# Patient Record
Sex: Male | Born: 1960 | Race: White | Hispanic: No | Marital: Married | Smoking: Never smoker
Health system: Southern US, Community
[De-identification: ages and names within clinical notes are randomized; demographics above are authoritative.]

## PROBLEM LIST (undated history)

## (undated) DIAGNOSIS — E039 Hypothyroidism, unspecified: Secondary | ICD-10-CM

## (undated) HISTORY — PX: CATARACT EXTRACTION: SUR2

## (undated) HISTORY — DX: Hypothyroidism, unspecified: E03.9

---

## 2015-05-29 HISTORY — PX: EYE SURGERY: SHX253

## 2015-08-01 ENCOUNTER — Encounter: Payer: Self-pay | Admitting: *Deleted

## 2015-08-01 ENCOUNTER — Encounter: Payer: Self-pay | Admitting: Family Medicine

## 2015-08-01 ENCOUNTER — Ambulatory Visit (INDEPENDENT_AMBULATORY_CARE_PROVIDER_SITE_OTHER): Payer: BLUE CROSS/BLUE SHIELD | Admitting: Family Medicine

## 2015-08-01 VITALS — BP 107/70 | HR 60 | Temp 98.4°F | Resp 16 | Ht 75.0 in | Wt 170.0 lb

## 2015-08-01 DIAGNOSIS — E559 Vitamin D deficiency, unspecified: Secondary | ICD-10-CM | POA: Diagnosis not present

## 2015-08-01 DIAGNOSIS — Z0000 Encounter for general adult medical examination without abnormal findings: Secondary | ICD-10-CM | POA: Diagnosis not present

## 2015-08-01 MED ORDER — VITAMIN D-3 25 MCG (1000 UT) PO CAPS: ORAL_CAPSULE | ORAL | Status: AC

## 2015-08-01 NOTE — Patient Instructions (Signed)
To get flu shot next week at work.

## 2015-08-01 NOTE — Progress Notes (Signed)
Name: Bradley Carson   MRN: [id number]    DOB: Dec 26, 1960   Date:08/01/2015       Progress Note  Subjective  Chief Complaint  Chief Complaint  Patient presents with  . Annual Exam    HPI  Here for complete physical.  Has no c/o.  Had hole in retina.  Treated urgically at [facility].  Plans to have colonoscopy early next year.   CMP, CBC TSH Lipids all normal.  Vit D sl. Low at 22.5 No problem-specific assessment & plan notes found for this encounter.   Past Medical History  Diagnosis Date  . Hypothyroidism     Past Surgical History  Procedure Laterality Date  . Eye surgery  05/29/2015    Family History  Problem Relation Age of Onset  . Cataracts Mother     Social History   Social History  . Marital Status: Married    Spouse Name: N/A  . Number of Children: N/A  . Years of Education: N/A   Occupational History  . Not on file.   Social History Main Topics  . Smoking status: Never Smoker   . Smokeless tobacco: Never Used  . Alcohol Use: 0.0 oz/week    0 Standard drinks or equivalent per week     Comment: occasional  . Drug Use: No  . Sexual Activity: Not on file   Other Topics Concern  . Not on file   Social History Narrative  . No narrative on file     Current outpatient prescriptions:  .  Cholecalciferol (VITAMIN D-3) 1000 UNITS CAPS, Take 1 capsule by mouth daily., Disp: , Rfl:   No Known Allergies   Review of Systems  Constitutional: Negative for fever, chills, weight loss and malaise/fatigue.  HENT: Negative for congestion, hearing loss and nosebleeds.   Eyes: Positive for blurred vision (L. eye, sec. to hole in retina.). Negative for double vision and pain.  Respiratory: Negative for cough, sputum production, shortness of breath and wheezing.   Cardiovascular: Negative for chest pain, palpitations, orthopnea and leg swelling.  Gastrointestinal: Negative for heartburn, abdominal pain and blood in stool.  Genitourinary: Negative for dysuria, urgency  and frequency.  Musculoskeletal: Negative for myalgias and joint pain.  Skin: Negative for itching and rash.  Neurological: Negative for dizziness, sensory change, focal weakness, weakness and headaches.  Psychiatric/Behavioral: Negative for depression. The patient is not nervous/anxious.       Objective  Filed Vitals:   08/01/15 1523  BP: 107/70  Pulse: 60  Temp: 98.4 F (36.9 C)  TempSrc: Oral  Resp: 16  Height: 6\' 3"  (1.905 m)  Weight: 170 lb (77.111 kg)    Physical Exam  Constitutional: He is oriented to person, place, and time and well-developed, well-nourished, and in no distress. No distress.  HENT:  Head: Normocephalic and atraumatic.  Right Ear: External ear normal.  Left Ear: External ear normal.  Nose: Nose normal.  Mouth/Throat: Oropharynx is clear and moist. No oropharyngeal exudate.  Eyes: Conjunctivae and EOM are normal. Pupils are equal, round, and reactive to light. No scleral icterus.  Fundoscopic exam:      The right eye shows no arteriolar narrowing, no AV nicking, no exudate and no hemorrhage.       The left eye shows no arteriolar narrowing, no AV nicking, no exudate and no hemorrhage.  Neck: Normal range of motion.  Cardiovascular: Normal rate, regular rhythm, normal heart sounds and intact distal pulses.  Exam reveals no gallop and no friction rub.  No murmur heard. Pulmonary/Chest: Effort normal and breath sounds normal. No respiratory distress. He has no wheezes. He has no rales.  Abdominal: Soft. Bowel sounds are normal. He exhibits no distension and no mass. There is no tenderness.  Genitourinary: Rectum normal, prostate normal and penis normal. No discharge found.  Musculoskeletal: Normal range of motion. He exhibits no edema or tenderness.  Neurological: He is alert and oriented to person, place, and time. No cranial nerve deficit. Gait normal.  Skin: Skin is warm and dry.  Psychiatric: Mood, memory, affect and judgment normal.  Vitals  reviewed.      No results found for this or any previous visit (from the past 2160 hour(s)).   Assessment & Plan  Problem List Items Addressed This Visit    None      No orders of the defined types were placed in this encounter.    1. Vitamin D deficiency  - Cholecalciferol (VITAMIN D-3) 1000 UNITS CAPS; Take 2 each day  Dispense: 100 capsule; Refill: 12  2. Health care maintenance

## 2015-11-19 ENCOUNTER — Other Ambulatory Visit: Payer: Self-pay | Admitting: Unknown Physician Specialty

## 2015-11-19 DIAGNOSIS — H903 Sensorineural hearing loss, bilateral: Secondary | ICD-10-CM

## 2015-12-24 ENCOUNTER — Ambulatory Visit
Admission: RE | Admit: 2015-12-24 | Discharge: 2015-12-24 | Disposition: A | Discharge status: Home / Self Care | Payer: BLUE CROSS/BLUE SHIELD | Source: Ambulatory Visit | Attending: Unknown Physician Specialty | Admitting: Unknown Physician Specialty

## 2015-12-24 DIAGNOSIS — H903 Sensorineural hearing loss, bilateral: Secondary | ICD-10-CM

## 2015-12-24 DIAGNOSIS — D333 Benign neoplasm of cranial nerves: Secondary | ICD-10-CM | POA: Diagnosis not present

## 2015-12-24 MED ORDER — GADOBENATE DIMEGLUMINE 529 MG/ML IV SOLN
20.0000 mL | Freq: Once | INTRAVENOUS | Status: AC | PRN
Start: 2015-12-24 — End: 2015-12-24
  Administered 2015-12-24: 16 mL via INTRAVENOUS

## 2015-12-29 ENCOUNTER — Other Ambulatory Visit: Payer: Self-pay | Admitting: Unknown Physician Specialty

## 2015-12-29 DIAGNOSIS — H903 Sensorineural hearing loss, bilateral: Secondary | ICD-10-CM

## 2016-04-17 IMAGING — MR MR BRAIN/IAC WO/W
7 of 11 series · 33 of 48 positions shown · IV contrast (multihance)
Comparison: None.

CLINICAL DATA: 54-year-old male with sensorineural hearing loss
over the last 2 years, worse on the right. No known injury. Initial
encounter.

EXAM:
MRI HEAD WITHOUT AND WITH CONTRAST
TECHNIQUE: Multiplanar, multiecho pulse sequences of the brain and surrounding
structures were obtained without and with intravenous contrast.
CONTRAST:  16mL MULTIHANCE GADOBENATE DIMEGLUMINE 529 MG/ML IV SOLN

[Series 2: T1 · sagittal · 5.0mm · 0.45mm/px · 3 of 27 slices shown (1 of 2)]
[im 1/27]
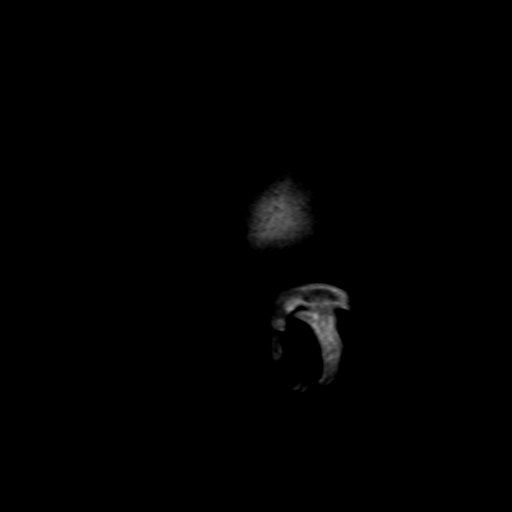
[im 14/27]
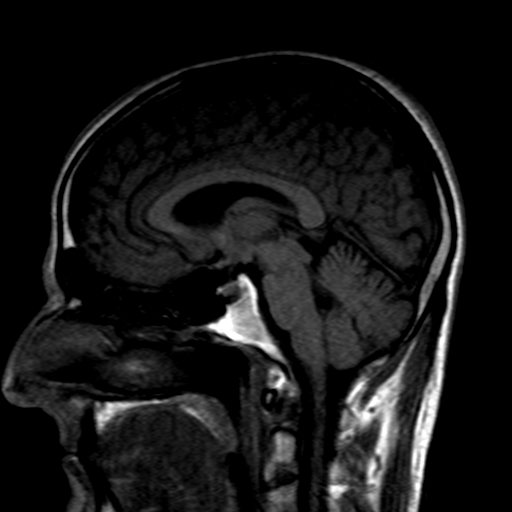
[im 27/27]
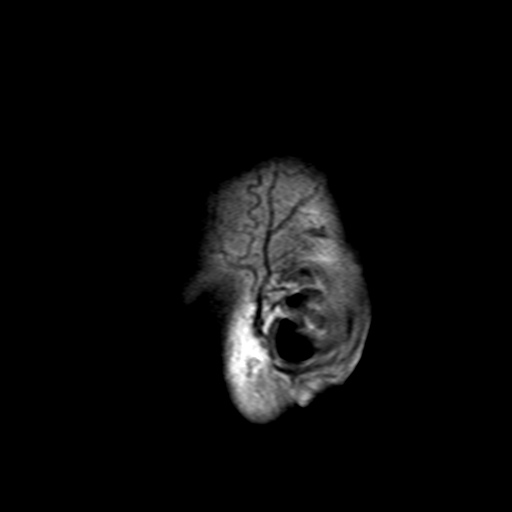

[Series 4: DWI · axial · 4.0mm · 0.94mm/px · z∈[-89,+79]mm · 6 of 43 slices shown (1 of 2)]
[im 1/43]
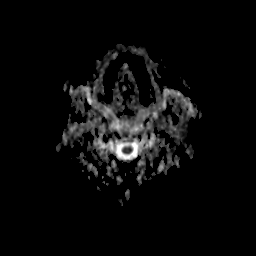
[im 9/43]
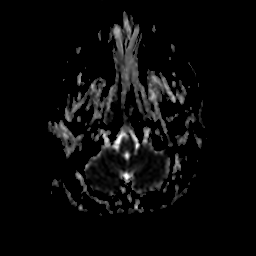
[im 17/43]
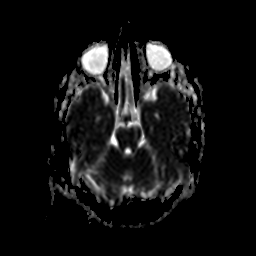
[im 26/43]
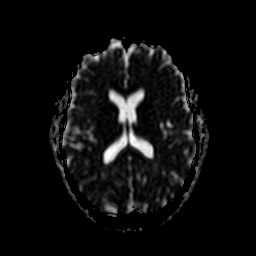
[im 34/43]
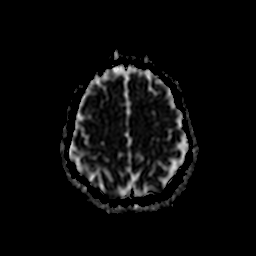
[im 43/43]
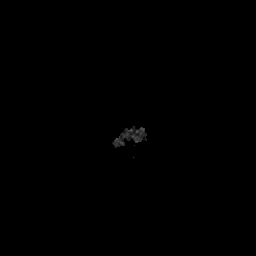

[Series 5: DWI · axial · 4.0mm · 0.94mm/px · z∈[-89,+79]mm · 6 of 43 slices shown (2 of 2)]
[im 1/43]
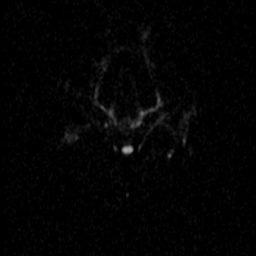
[im 9/43]
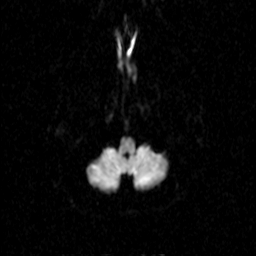
[im 17/43]
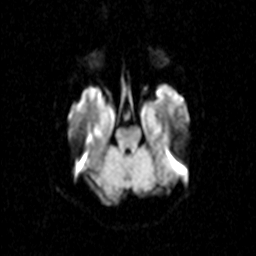
[im 26/43]
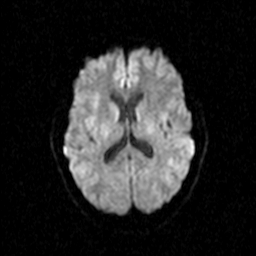
[im 34/43]
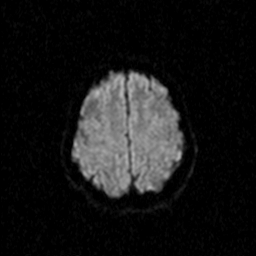
[im 43/43]
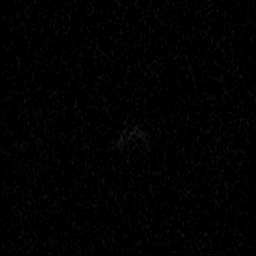

[Series 6: T2 · axial · 5.0mm · 0.45mm/px · z∈[-86,+83]mm · 4 of 27 slices shown]
[im 1/27]
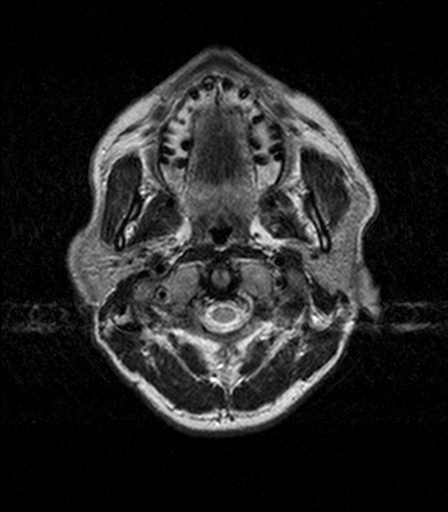
[im 9/27]
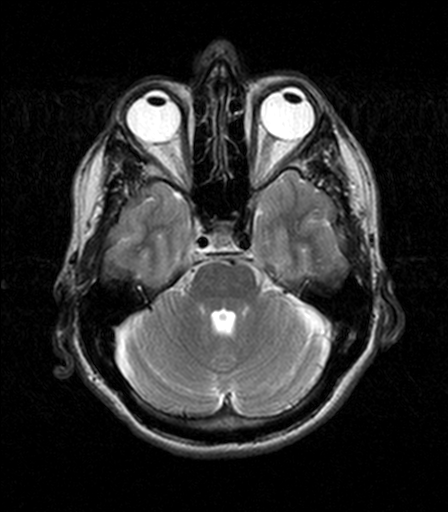
[im 18/27]
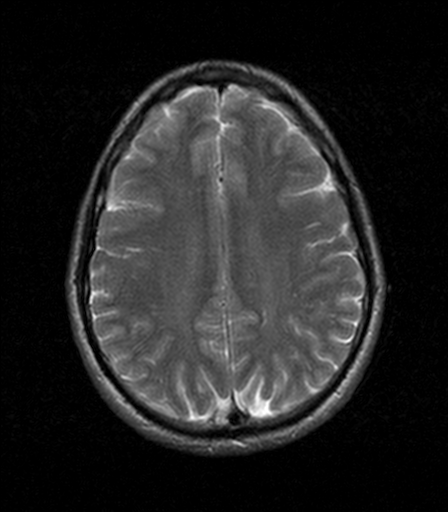
[im 27/27]
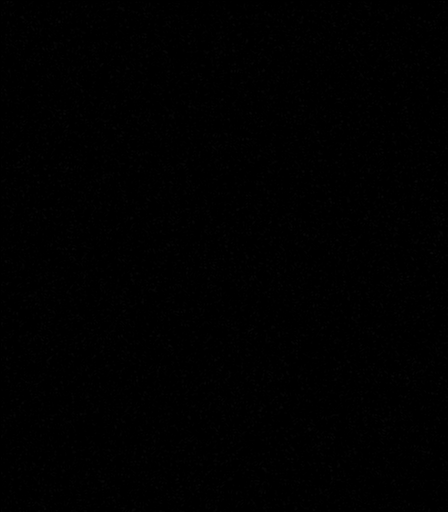

[Series 7: FLAIR · axial · 5.0mm · 0.90mm/px · z∈[-86,+83]mm · 4 of 27 slices shown]
[im 1/27]
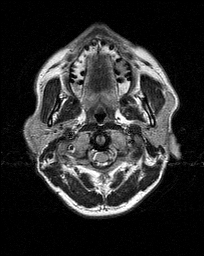
[im 9/27]
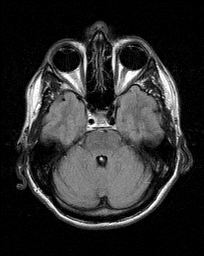
[im 18/27]
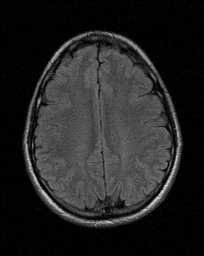
[im 27/27]
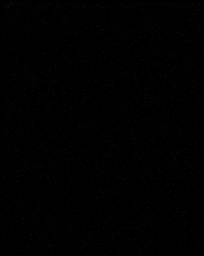

[Series 8: T1 · coronal · 3.0mm · 0.70mm/px · 1 of 15 slices shown (2 of 2)]
[im 1/15]
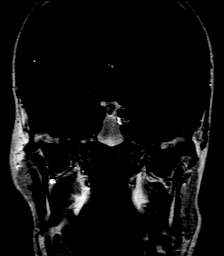

[Series 13: T1 post-contrast · axial · 3.0mm · 0.45mm/px · z∈[-96,+93]mm · 9 of 64 slices shown]
[im 1/64]
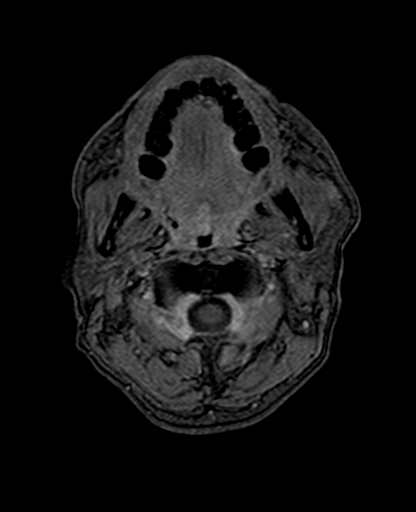
[im 8/64]
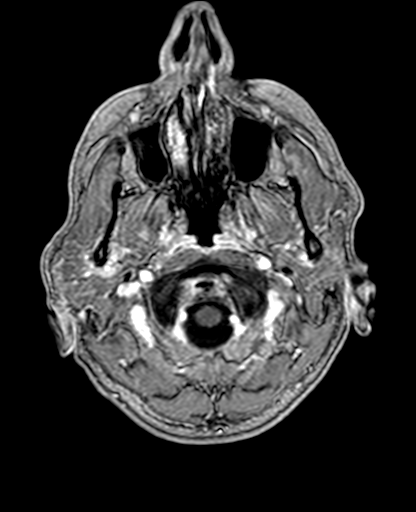
[im 16/64]
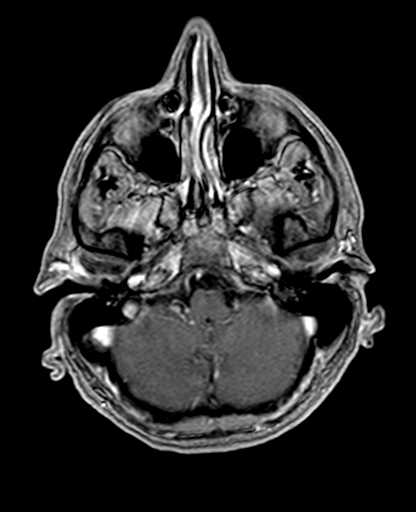
[im 24/64]
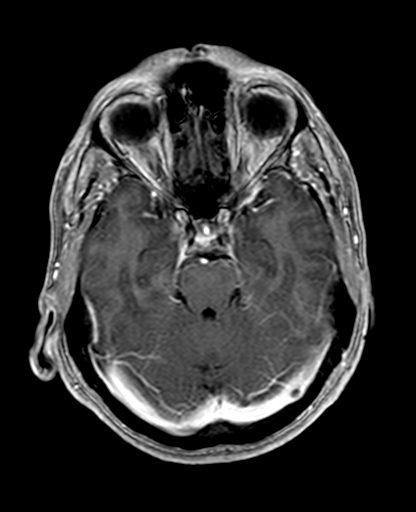
[im 32/64]
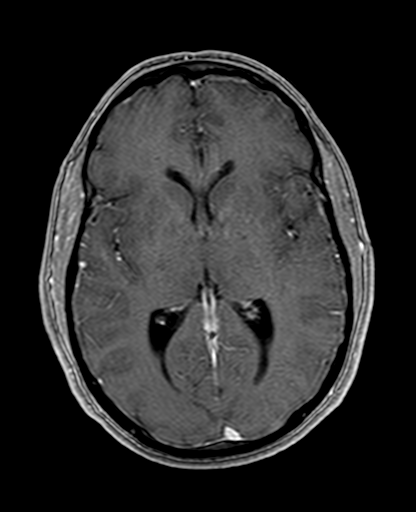
[im 40/64]
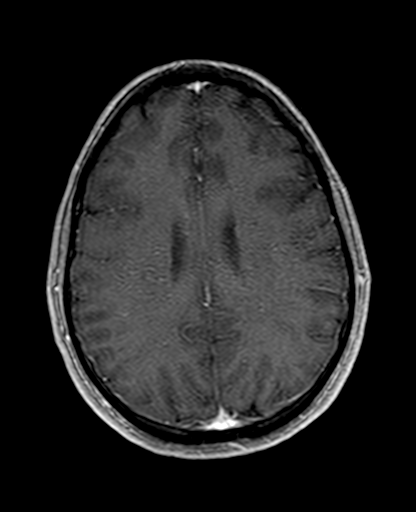
[im 48/64]
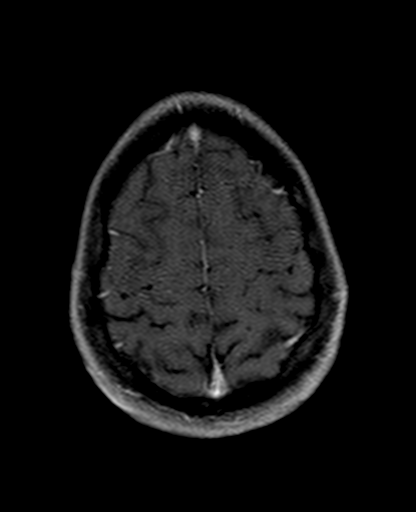
[im 56/64]
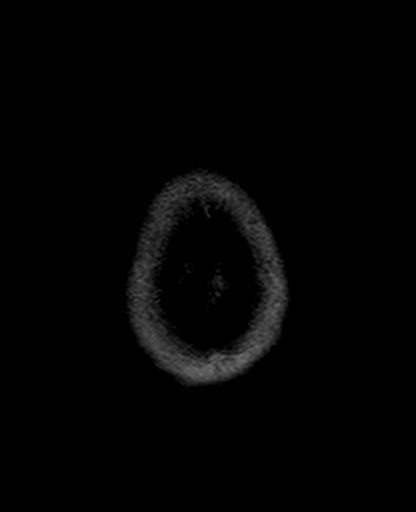
[im 64/64]
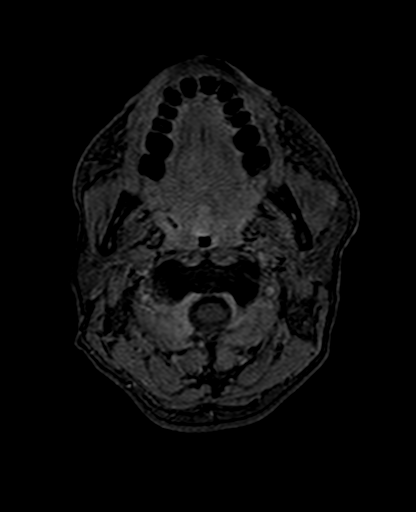

[33 of 48 positions shown; findings below may reference images not displayed]

FINDINGS: Cerebral volume is normal. No restricted diffusion to suggest acute
infarction. No midline shift, mass effect, evidence of mass lesion,
ventriculomegaly, extra-axial collection or acute intracranial
hemorrhage. Cervicomedullary junction and pituitary are within
normal limits. Negative visualized cervical spine. Major
intracranial vascular flow voids are within normal limits. Gray and
white matter signal is within normal limits for age throughout the
brain. Excluding the IAC findings described below, No abnormal
enhancement identified.

Visualized bone marrow signal is within normal limits. Visualized
orbit soft tissues are within normal limits. Paranasal sinuses are
clear. Negative scalp soft tissues.

Dedicated IAC imaging. Both cerebellopontine angles remain within
normal limits. The left side cisternal and intracanalicular 7th and
8th cranial nerves segments are within normal limits.

However, there is a solidly enhancing mass affecting the right
seventh and eighth nerves. The lesion is primarily within the right
IAC, protruding slightly but not effacing the right lateral pre
pontine cistern. The lesion measures 6 x 11 x 6 mm (AP by transverse
by CC), and tracks nearly to the fundus (series 9, image 28). There
is preserved T2 signal in the right cochlea and vestibular
structures with no abnormal enhancement. The right mastoids are
clear.

The left cochlea and vestibular structures also appear normal. Left
mastoids are clear. Negative stylomastoid foramina. Negative
visualized parotid glands. No other abnormal enhancement identified.
IMPRESSION: 1. Positive for small right vestibular schwannoma occupying the IAC,
6 x 11 x 6 mm.
2. Otherwise negative internal auditory imaging.
3. Otherwise normal MRI appearance of the brain.

## 2016-06-22 ENCOUNTER — Ambulatory Visit: Payer: BLUE CROSS/BLUE SHIELD

## 2016-06-29 ENCOUNTER — Ambulatory Visit
Admission: RE | Admit: 2016-06-29 | Discharge: 2016-06-29 | Disposition: A | Discharge status: Home / Self Care | Payer: BLUE CROSS/BLUE SHIELD | Source: Ambulatory Visit | Attending: Unknown Physician Specialty | Admitting: Unknown Physician Specialty

## 2016-06-29 DIAGNOSIS — H903 Sensorineural hearing loss, bilateral: Secondary | ICD-10-CM

## 2016-06-29 DIAGNOSIS — D333 Benign neoplasm of cranial nerves: Secondary | ICD-10-CM | POA: Insufficient documentation

## 2016-06-29 MED ORDER — GADOBENATE DIMEGLUMINE 529 MG/ML IV SOLN
15.0000 mL | Freq: Once | INTRAVENOUS | Status: AC | PRN
  Administered 2016-06-29: 15 mL via INTRAVENOUS

## 2016-06-30 ENCOUNTER — Other Ambulatory Visit: Payer: Self-pay | Admitting: Unknown Physician Specialty

## 2016-06-30 DIAGNOSIS — D333 Benign neoplasm of cranial nerves: Secondary | ICD-10-CM

## 2017-02-17 ENCOUNTER — Encounter: Payer: Self-pay | Admitting: Family Medicine

## 2017-02-18 LAB — COMPREHENSIVE METABOLIC PANEL
ALK PHOS: 52 U/L
ALT: 13
AST: 11 U/L
Albumin/Globulin Ratio: 1.5
Albumin: 4.3
BILIRUBIN TOTAL: 0.5 mg/dL
BUN / CREAT RATIO: 13
BUN: 16
CHLORIDE: 105 mmol/L
Calcium: 9.6 mg/dL
Creat: 1.21
EGFR (African American): 77
GFR CALC NON AF AMER: 67
GGT: 18
GLUCOSE: 97
Globulin: 2.9
Iron: 91
LDH: 179 (ref ?–224)
POTASSIUM: 4.8 mmol/L
Phosphorus: 3.3
Sodium: 145 — AB (ref ?–144)
TOTAL PROTEIN: 7.2 g/dL
Uric Acid: 6.3

## 2017-02-18 LAB — CBC WITH DIFFERENTIAL
BASO%: 1 %
BASOS ABS: 0 /uL
EOS%: 6 %
Eosinophils Absolute: 0 /uL
GRANS (ABSOLUTE): 0
GRANULOCYTE PERCENT: 0 % — AB (ref 37–80)
HCT: 44 %
Hemoglobin: 14.8
LYMPH%: 30 %
LYMPHO ABS: 2 /uL
MCH: 28.7
MCHC: 33.8
MCV: 85
Monocyte %: 10
Monocytes Absolute: 1 /uL
NEUTRO ABS: 3 /uL
Neutrophil %: 53
PLATELETS: 249
RBC: 5.16
RDW: 14.2
WBC: 5.3

## 2017-02-18 LAB — THYROID PANEL
FREE THYROXINE INDEX: 1.9
T3 UPTAKE: 23 — AB (ref 24–?)
THYROXINE (T4): 8.4
TSH: 4.26

## 2017-02-18 LAB — PSA: Prostate Specific Ag, Serum: 1.6

## 2017-02-18 LAB — VITAMIN D 25 HYDROXY (VIT D DEFICIENCY, FRACTURES): Vitamin D, 25-Hydroxy: 20.7 — AB (ref 30–?)

## 2017-02-18 LAB — LIPID PANEL
CHOL/HDL RATIO: 3.1
Cholesterol, Total: 171
HDL Cholesterol: 55
LDL Cholesterol (Calc): 102 — AB (ref ?–99)
Triglycerides: 69
VLDL: 14 mg/dL

## 2017-03-25 ENCOUNTER — Ambulatory Visit
Admission: RE | Admit: 2017-03-25 | Discharge: 2017-03-25 | Disposition: A | Discharge status: Home / Self Care | Payer: BLUE CROSS/BLUE SHIELD | Source: Ambulatory Visit | Attending: Unknown Physician Specialty | Admitting: Unknown Physician Specialty

## 2017-03-25 DIAGNOSIS — D333 Benign neoplasm of cranial nerves: Secondary | ICD-10-CM

## 2017-03-25 MED ORDER — GADOBENATE DIMEGLUMINE 529 MG/ML IV SOLN
16.0000 mL | Freq: Once | INTRAVENOUS | Status: AC | PRN
  Administered 2017-03-25: 16 mL via INTRAVENOUS

## 2017-03-31 ENCOUNTER — Ambulatory Visit (INDEPENDENT_AMBULATORY_CARE_PROVIDER_SITE_OTHER): Payer: BLUE CROSS/BLUE SHIELD | Admitting: Nurse Practitioner

## 2017-03-31 ENCOUNTER — Encounter: Payer: Self-pay | Admitting: Nurse Practitioner

## 2017-03-31 VITALS — BP 101/60 | HR 57 | Temp 98.2°F | Ht 74.5 in | Wt 169.8 lb

## 2017-03-31 DIAGNOSIS — Z0000 Encounter for general adult medical examination without abnormal findings: Secondary | ICD-10-CM

## 2017-03-31 DIAGNOSIS — E559 Vitamin D deficiency, unspecified: Secondary | ICD-10-CM | POA: Diagnosis not present

## 2017-03-31 DIAGNOSIS — Z8669 Personal history of other diseases of the nervous system and sense organs: Secondary | ICD-10-CM

## 2017-03-31 DIAGNOSIS — Z9842 Cataract extraction status, left eye: Secondary | ICD-10-CM

## 2017-03-31 NOTE — Progress Notes (Signed)
Subjective:    Patient ID: Bradley Carson, male    DOB: 1961/05/24, 56 y.o.   MRN: [id number]  Bradley Carson is a 56 y.o. male presenting on 03/31/2017 for Annual Exam   HPI Annual Physical Exam Patient has been feeling well.  They have no acute concerns today. Sleeps 6.5-7 (some 8-9) hours per night uninterrupted.  Occasionally (10%) one nocturia.  Health Maintenance Weight/BMI: healthy Physical activity: active - Tennis, running, walking 8-10 miles per day. Diet: [employer] foods 2-3 times per week.  Eating out 2 times per week. Sweets (ice cream).  Last year cut way back on ice cream.  Seatbelt: Always. Prostate exam/PSA: normal limits - follow for increase with result next year Colonoscopy: Never.  Referral in place.  Pt will call. Tetanus: Probably required   VIT D DEFICIENCY: Vit D is low at 20.7 with labs 02/17/2017.  Has been previously low and recommended to supplement, but not consistently taking vitamin D supplement.  Probably gets qod.  Pt states he wIll take ownership.  History of detached retina L couple years ago followed by cataract L eye - Removed Now vision  20/30  Believes he is healthy now and is ready to tackle recommended health maintenance.   NO cataract on R - healthy.  Social History  Substance Use Topics  . Smoking status: Never Smoker  . Smokeless tobacco: Never Used  . Alcohol use 0.0 oz/week     Comment: occasional    Review of Systems  All other systems reviewed and are negative.  Per HPI unless specifically indicated above  Depression screen [facility] 2/9 03/31/2017  Decreased Interest 0  Down, Depressed, Hopeless 0  PHQ - 2 Score 0       Objective:    BP 101/60   Pulse (!) 57   Temp 98.2 F (36.8 C) (Oral)   Ht 6' 2.5" (1.892 m)   Wt 169 lb 12.8 oz (77 kg)   BMI 21.51 kg/m     Wt Readings from Last 3 Encounters:  03/31/17 169 lb 12.8 oz (77 kg)  08/01/15 170 lb (77.1 kg)  08/01/15 170 lb 3.2 oz (77.2 kg)    Physical Exam    Constitutional: He is oriented to person, place, and time. He appears well-developed and well-nourished. No distress.  HENT:  Head: Normocephalic and atraumatic.  Right Ear: Hearing, tympanic membrane, external ear and ear canal normal.  Left Ear: Tympanic membrane, external ear and ear canal normal.  Nose: Nose normal.  Mouth/Throat: Oropharynx is clear and moist.  Eyes: Conjunctivae and EOM are normal. Pupils are equal, round, and reactive to light.  Neck: Normal range of motion. Neck supple. No JVD present. Carotid bruit is not present. No tracheal deviation present. No thyromegaly present.  Cardiovascular: Normal rate, regular rhythm, normal heart sounds and intact distal pulses.   No murmur heard. Pulmonary/Chest: Effort normal and breath sounds normal. No respiratory distress.  Abdominal: Soft. Bowel sounds are normal. He exhibits no distension and no mass. There is no tenderness.  Genitourinary:  Genitourinary Comments: Pt declined  Musculoskeletal: Normal range of motion. He exhibits no edema, tenderness or deformity.  Lymphadenopathy:    He has no cervical adenopathy.  Neurological: He is alert and oriented to person, place, and time. No cranial nerve deficit.  Skin: Skin is warm and dry.  Psychiatric: He has a normal mood and affect. His behavior is normal. Judgment and thought content normal.     Results for orders placed  or performed in visit on 02/17/17  Comprehensive metabolic panel  Result Value Ref Range   Glucose 97    BUN 16    EGFR (African American) 77    EGFR (Non-African Amer.) 67    BUN/Creatinine Ratio 13    Sodium 145 (A) 144   Potassium 4.8 mmol/L   Chloride 105 mmol/L   Calcium 9.6 mg/dL   Phosphorus 3.3    Total Protein 7.2 g/dL   Albumin 4.3    Globulin 2.9    Albumin/Globulin Ratio 1.5    Total Bilirubin 0.5 mg/dL   Alkaline Phosphatase 52 U/L   LDH 179 224   AST 11 U/L   ALT 13    GGT 18    Iron 91    Uric Acid 6.3    Creat 1.21    Lipid panel  Result Value Ref Range   Cholesterol, Total 171    Triglycerides 69    HDL Cholesterol 55    VLDL 14 mg/dL   LDL Cholesterol (Calc) 102 (A) 99   Total CHOL/HDL Ratio 3.1   Thyroid Profile  Result Value Ref Range   TSH 4.260    Thyroxine (T4) 8.4    T3 Uptake 23 (A) 24   Free Thyroxine Index 1.9   PSA  Result Value Ref Range   Prostate Specific Ag, Serum 1.6   CBC With Differential  Result Value Ref Range   WBC 5.3    RBC 5.16    Hemoglobin 14.8    HCT 44 %   MCV 85    MCH 28.7    MCHC 33.8    RDW 14.2    Platelets 249    Neutrophil % 53    LYMPH% 30 %   Monocyte % 10    EOS% 6 %   BASO% 1 %   Granulocyte percent 0 (A) 37 - 80 %G   Grans (Absolute) 0    Neutrophils Absolute 3 /L   Lymphocytes Absolute 2 /L   Monocytes Absolute 1 /L   Eosinophils Absolute 0 /L   Basophils Absolute 0 /L  VITAMIN D 25 Hydroxy (Vit-D Deficiency, Fractures)  Result Value Ref Range   Vitamin D, 25-Hydroxy 20.7 (A) 30      Assessment & Plan:   Problem List Items Addressed This Visit      Other   History of retinal detachment left eye    Resolved and stable.  Plan: 1. Continue appointments with optometry and opthalmology      H/O cataract extraction, left    S/P extraction.  Vision improved.  Plan: 1. Continue followup with optometry and opthalmology as needed.      Vitamin D insufficiency    Pt remains insufficient.  Inconsistent medication administration for supplementation daily.  Plan: 1. Encouraged 2 tablets Vitamin D3 1,000 IU every other day if missing doses.  Goal 7 tablets or vitamin D3 7,000 IU in 1 week.  Fat soluble and storage in body - QOD dosing appropriate if improves adherence.       Other Visit Diagnoses    Annual physical exam    -  Primary   Physical exam with no new findings.  Well adult with no acute concerns  Plan: 1. Obtain health maintenance screenings. 2. Return 1 year for annual physical.          Follow up  plan: Return in about 1 year (around 03/31/2018) for annual physical.   Cassell Smiles, DNP, AGPCNP-BC Adult Gerontology  Primary Care Nurse Practitioner [facility] 04/01/2017, 12:43 PM

## 2017-03-31 NOTE — Patient Instructions (Addendum)
Bradley Carson, Thank you for coming in to clinic today.  1. Other recommended health maintenance items: - Tetanus vaccine records - Lab tests for Hep C and HIV one- time screenings.  2. Your exam is unchanged for today.    Please schedule a follow-up appointment with Cassell Smiles, AGNP to Return in about 1 year (around 03/31/2018) for annual physical.  If you have any other questions or concerns, please feel free to call the clinic or send a message through [place] [facility]. You may also schedule an earlier appointment if necessary.  Cassell Smiles, DNP, AGNP-BC Adult Gerontology Nurse Practitioner [facility]   Hepatitis C Test Hepatitis C is a liver infection caused by the hepatitis C virus (HCV). Hepatitis C is usually diagnosed with two blood tests. One test checks for antibodies to the virus in your blood. Antibodies are proteins that your body makes to fight infections. If you have antibodies to HCV, it means you have been infected. It does not mean you are still infected. An HCV infection may not cause any symptoms, and you may be able to get rid of the virus without treatment. If you have antibodies to HCV, you will need to have another test to find out if you are still infected. This test is called the HCV RNA qualitative test. It looks for genetic material from HCV in your blood. If you are diagnosed with an active HCV infection, you may also have an HCV RNA quantitative test to measure the amount of virus in your blood (viral load). Your health care provider may repeat this test in order to monitor you during treatment. You may also have an HCV genotype test to identify the kind (genotype) of virus you have. This helps your health care provider determine the best treatment for you. You may be tested if you show symptoms of HCV infection. It is important to be tested because hepatitis C can lead to serious liver damage if not treated. All HCV tests require a blood sample taken  from a vein in your hand or arm. What do the results mean? It is your responsibility to obtain your test results. Ask the lab or department performing the test when and how you will get your results. Talk to your health care provider if you have any questions about your test results. Results of both the HCV antibody test and the HCV RNA test will be either positive or negative. Meaning of Negative Test Results   If your HCV antibody test is negative, it may mean that you have not been infected with HCV. However, it can take a few months for the antibodies to build up in your blood. If it is possible you may have been infected recently, you may need to repeat the test.  If your HCV RNA qualitative test is negative, this means it is unlikely you have an active HCV infection. Meaning of Positive Test Results   If your HCV antibody test is positive, it is likely that you are infected or have been infected with HCV.  If your HCV RNA qualitative test is also positive, it confirms you have an active HCV infection. Talk with your health care provider to discuss your results, treatment options, and if necessary, the need for more tests. Talk with your health care provider if you have any questions about your results. This information is not intended to replace advice given to you by your health care provider. Make sure you discuss any questions you have with  your health care provider. Document Released: 12/04/2004 Document Revised: 07/07/2016 Document Reviewed: 02/04/2014 Elsevier Interactive Patient Education  2017 [employer].

## 2017-04-01 DIAGNOSIS — E559 Vitamin D deficiency, unspecified: Secondary | ICD-10-CM | POA: Insufficient documentation

## 2017-04-01 DIAGNOSIS — Z8669 Personal history of other diseases of the nervous system and sense organs: Secondary | ICD-10-CM | POA: Insufficient documentation

## 2017-04-01 DIAGNOSIS — Z9842 Cataract extraction status, left eye: Secondary | ICD-10-CM | POA: Insufficient documentation

## 2017-04-01 NOTE — Assessment & Plan Note (Addendum)
Pt remains insufficient.  Inconsistent medication administration for supplementation daily.  Plan: 1. Encouraged 2 tablets Vitamin D3 1,000 IU every other day if missing doses.  Goal 7 tablets or vitamin D3 7,000 IU in 1 week.  Fat soluble and storage in body - QOD dosing appropriate if improves adherence.

## 2017-04-01 NOTE — Assessment & Plan Note (Signed)
Resolved and stable.  Plan: 1. Continue appointments with optometry and opthalmology

## 2017-04-01 NOTE — Assessment & Plan Note (Signed)
S/P extraction.  Vision improved.  Plan: 1. Continue followup with optometry and opthalmology as needed.

## 2017-04-04 NOTE — Progress Notes (Signed)
I have reviewed this encounter including the documentation in this note and/or discussed this patient with the provider, Cassell Smiles, AGPCNP-BC. I am certifying that I agree with the content of this note as supervising physician.  Nobie Putnam, DO [facility] 04/04/2017, 1:09 PM

## 2017-05-04 ENCOUNTER — Telehealth: Payer: Self-pay

## 2017-05-04 DIAGNOSIS — Z1211 Encounter for screening for malignant neoplasm of colon: Secondary | ICD-10-CM

## 2017-05-04 NOTE — Telephone Encounter (Signed)
Patient called requesting a referral for screening colonscoly to Dr. Vira Agar.

## 2017-05-05 NOTE — Telephone Encounter (Signed)
Detail message left.

## 2017-05-23 ENCOUNTER — Telehealth: Payer: Self-pay

## 2017-05-23 NOTE — Telephone Encounter (Signed)
The pt currently have an appt scheduled with the open access nurse on Sept 12th @ 10:30 am. The protocol is to first be seen by the open access nurse and then scheduled for the colonoscopy.

## 2017-05-23 NOTE — Telephone Encounter (Signed)
-----   Message from Mikey College, NP sent at 05/23/2017 10:23 AM EDT ----- Regarding: F/U colonoscopy Please f/u on colonoscopy.  Pt stated at annual physical that he did not need referral. Have not yet seen results.  Please place new referral if pt needs one.  Thanks, Ander Purpura

## 2017-05-23 NOTE — Telephone Encounter (Signed)
Great thanks!.  Pt had stated he would have this done about 3 months ago.  Following up since hadn't seen results.

## 2017-09-15 LAB — HM COLONOSCOPY

## 2018-05-12 ENCOUNTER — Other Ambulatory Visit: Payer: Self-pay | Admitting: Nurse Practitioner

## 2018-05-12 NOTE — Telephone Encounter (Signed)
Pt. Called requesting Cipro to be called into drug store pt leaving for [place] July 8th . [phone]

## 2018-05-12 NOTE — Telephone Encounter (Signed)
Pt will need travel medicine appointment.  He can do this at employee health and wellness, health department, or here if he would like. He may need other vaccines as well.

## 2018-05-15 NOTE — Telephone Encounter (Signed)
Attempted to contact the pt, no answer or vm.  

## 2018-05-16 NOTE — Telephone Encounter (Signed)
Left message

## 2018-05-19 NOTE — Telephone Encounter (Signed)
No answer x2 

## 2019-02-01 ENCOUNTER — Ambulatory Visit (INDEPENDENT_AMBULATORY_CARE_PROVIDER_SITE_OTHER): Payer: 59 | Admitting: Nurse Practitioner

## 2019-02-01 ENCOUNTER — Other Ambulatory Visit: Payer: Self-pay

## 2019-02-01 ENCOUNTER — Encounter: Payer: Self-pay | Admitting: Nurse Practitioner

## 2019-02-01 VITALS — BP 115/60 | HR 54 | Temp 97.8°F | Ht 74.5 in | Wt 177.0 lb

## 2019-02-01 DIAGNOSIS — Z0000 Encounter for general adult medical examination without abnormal findings: Secondary | ICD-10-CM

## 2019-02-01 NOTE — Progress Notes (Signed)
Subjective:    Patient ID: Bradley Carson, male    DOB: Jun 24, 1961, 58 y.o.   MRN: [id number]  Bradley Carson is a 58 y.o. male presenting on 02/01/2019 for Annual Exam   HPI Annual Physical Exam Patient has been feeling well.  They have no acute concerns today. Sleeps 7-8 hours per night uninterrupted.  Occasionally less.  HEALTH MAINTENANCE: Weight/BMI: healthy Physical activity: regular cycling, occasional running, tennis/swimming Diet: good, healthy diet Seatbelt: regularly Sunscreen: regular when outdoors (more in summer) Prostate exam/PSA: DUE Colon Cancer Screen: done HIV/HEP C: due Optometry: regular - past retinal surgery, cataract surgery - annual appt at [facility]: regular  VACCINES: Tetanus: patient will verify Influenza: declined Shingles: recommended  Past Medical History:  Diagnosis Date  . Hypothyroidism    Past Surgical History:  Procedure Laterality Date  . EYE SURGERY  05/29/2015   Social History   Socioeconomic History  . Marital status: Married    Spouse name: Not on file  . Number of children: Not on file  . Years of education: Not on file  . Highest education level: Not on file  Occupational History  . Not on file  Social Needs  . Financial resource strain: Not on file  . Food insecurity:    Worry: Not on file    Inability: Not on file  . Transportation needs:    Medical: Not on file    Non-medical: Not on file  Tobacco Use  . Smoking status: Never Smoker  . Smokeless tobacco: Never Used  Substance and Sexual Activity  . Alcohol use: Yes    Alcohol/week: 0.0 standard drinks    Comment: occasional  . Drug use: No  . Sexual activity: Not on file  Lifestyle  . Physical activity:    Days per week: Not on file    Minutes per session: Not on file  . Stress: Not on file  Relationships  . Social connections:    Talks on phone: Not on file    Gets together: Not on file    Attends religious service: Not on file   Active member of club or organization: Not on file    Attends meetings of clubs or organizations: Not on file    Relationship status: Not on file  . Intimate partner violence:    Fear of current or ex partner: Not on file    Emotionally abused: Not on file    Physically abused: Not on file    Forced sexual activity: Not on file  Other Topics Concern  . Not on file  Social History Narrative  . Not on file   Family History  Problem Relation Age of Onset  . Cataracts Mother    Current Outpatient Medications on File Prior to Visit  Medication Sig  . Ascorbic Acid (VITAMIN C) 1000 MG tablet Take 1,000 mg by mouth 2 (two) times daily.  . Cholecalciferol (VITAMIN D-3) 1000 UNITS CAPS Take 2 each day   No current facility-administered medications on file prior to visit.     Review of Systems  Constitutional: Negative for activity change, appetite change, fatigue and unexpected weight change.  HENT: Negative for congestion, hearing loss and trouble swallowing.   Eyes: Negative for visual disturbance.  Respiratory: Negative for choking, shortness of breath and wheezing.   Cardiovascular: Negative for chest pain and palpitations.  Gastrointestinal: Negative for abdominal pain, blood in stool, constipation and diarrhea.  Genitourinary: Negative for difficulty urinating, discharge, dysuria, flank pain,  genital sores, penile pain, penile swelling, scrotal swelling and testicular pain.  Musculoskeletal: Negative for arthralgias, back pain and myalgias.  Skin: Negative for color change, rash and wound.  Allergic/Immunologic: Negative for environmental allergies.  Neurological: Negative for dizziness, seizures, weakness and headaches.  Psychiatric/Behavioral: Negative for behavioral problems, decreased concentration, dysphoric mood, sleep disturbance and suicidal ideas. The patient is not nervous/anxious.    Per HPI unless specifically indicated above     Objective:    BP 115/60 (BP  Location: Left Arm, Patient Position: Sitting, Cuff Size: Normal)   Pulse (!) 54   Temp 97.8 F (36.6 C) (Oral)   Ht 6' 2.5" (1.892 m)   Wt 177 lb (80.3 kg)   BMI 22.42 kg/m   Wt Readings from Last 3 Encounters:  02/01/19 177 lb (80.3 kg)  03/31/17 169 lb 12.8 oz (77 kg)  08/01/15 170 lb (77.1 kg)    Physical Exam Vitals signs and nursing note reviewed.  Constitutional:      General: He is not in acute distress.    Appearance: He is well-developed.  HENT:     Head: Normocephalic and atraumatic.     Right Ear: External ear normal.     Left Ear: External ear normal.     Nose: Nose normal.  Eyes:     Conjunctiva/sclera: Conjunctivae normal.     Pupils: Pupils are equal, round, and reactive to light.  Neck:     Musculoskeletal: Normal range of motion and neck supple.     Thyroid: No thyromegaly.     Vascular: No JVD.     Trachea: No tracheal deviation.  Cardiovascular:     Rate and Rhythm: Normal rate and regular rhythm.     Heart sounds: Normal heart sounds. No murmur. No friction rub. No gallop.   Pulmonary:     Effort: Pulmonary effort is normal. No respiratory distress.     Breath sounds: Normal breath sounds.  Abdominal:     General: Bowel sounds are normal. There is no distension.     Palpations: Abdomen is soft.     Tenderness: There is no abdominal tenderness.  Genitourinary:    Comments: Genital and Rectal Exam chaperoned by Donnie Mesa, CMA Rectal/DRE: Normal external exam without hemorrhoids fissures or abnormality. DRE with palpation of very mildly enlarged prostate smooth symmetrical without nodule or tenderness.  Musculoskeletal: Normal range of motion.  Lymphadenopathy:     Cervical: No cervical adenopathy.  Skin:    General: Skin is warm and dry.  Neurological:     Mental Status: He is alert and oriented to person, place, and time.     Cranial Nerves: No cranial nerve deficit.  Psychiatric:        Behavior: Behavior normal.        Thought Content:  Thought content normal.        Judgment: Judgment normal.      Results for orders placed or performed in visit on 02/01/19  HM COLONOSCOPY  Result Value Ref Range   HM Colonoscopy See Report (in chart) See Report (in chart), Patient Reported      Assessment & Plan:   Problem List Items Addressed This Visit    None    Visit Diagnoses    Encounter for annual physical exam    -  Primary   Relevant Orders   TSH   PSA   Lipid panel   Hemoglobin A1c   COMPLETE METABOLIC PANEL WITH GFR   CBC with Differential/Platelet  Hepatitis C antibody   HIV Antibody (routine testing w rflx)    Annual physical exam with no new findings.  Well adult with no acute concerns.  Plan: 1. Obtain health maintenance screenings as above according to age. - Increase physical activity to 30 minutes most days of the week.  - Eat healthy diet high in vegetables and fruits; low in refined carbohydrates. - Screening labs and tests as ordered 2. Return 1 year for annual physical.     Follow up plan: Return in about 1 year (around 02/01/2020) for annual physical.  Cassell Smiles, DNP, AGPCNP-BC Adult Gerontology Primary Care Nurse Practitioner [facility] 02/01/2019, 2:34 PM

## 2019-02-01 NOTE — Patient Instructions (Addendum)
Evelina Dun,   Thank you for coming in to clinic today.  1. Normal exam today with very slightly enlarged prostate.  2. You will be due for  FASTING BLOOD WORK.  This means you should eat no food or drink after midnight.  Drink only water or coffee without cream/sugar on the morning of your lab visit. - Please go ahead and schedule a "Lab Only" visit in the morning at the clinic for lab draw in the next 7 days. - Your results will be available about 2-3 days after blood draw.  If you have set up a MyChart account, you can can log in to MyChart online to view your results and a brief explanation. Also, we can discuss your results together at your next office visit if you would like.  - Lab is open at  8am -11:30  Please schedule a follow-up appointment with Cassell Smiles, AGNP. Return in about 1 year (around 02/01/2020) for annual physical.    If you have any other questions or concerns, please feel free to call the clinic or send a message through [place]. You may also schedule an earlier appointment if necessary.  You will receive a survey after today's visit either digitally by e-mail or paper by [employer]. Your experiences and feedback matter to [place].  Please respond so we know how we are doing as we provide care for you.   Cassell Smiles, DNP, AGNP-BC Adult Gerontology Nurse Practitioner [place]

## 2019-02-05 LAB — CBC WITH DIFFERENTIAL/PLATELET
Absolute Monocytes: 400 cells/uL (ref 200–950)
Basophils Absolute: 39 cells/uL (ref 0–200)
Basophils Relative: 0.9 %
Eosinophils Absolute: 219 cells/uL (ref 15–500)
Eosinophils Relative: 5.1 %
HCT: 43.7 % (ref 38.5–50.0)
Hemoglobin: 14.4 g/dL (ref 13.2–17.1)
Lymphs Abs: 1561 cells/uL (ref 850–3900)
MCH: 28.2 pg (ref 27.0–33.0)
MCHC: 33 g/dL (ref 32.0–36.0)
MCV: 85.5 fL (ref 80.0–100.0)
MPV: 9.6 fL (ref 7.5–12.5)
Monocytes Relative: 9.3 %
Neutro Abs: 2081 cells/uL (ref 1500–7800)
Neutrophils Relative %: 48.4 %
Platelets: 299 10*3/uL (ref 140–400)
RBC: 5.11 10*6/uL (ref 4.20–5.80)
RDW: 13 % (ref 11.0–15.0)
Total Lymphocyte: 36.3 %
WBC: 4.3 10*3/uL (ref 3.8–10.8)

## 2019-02-05 LAB — COMPLETE METABOLIC PANEL WITH GFR
AG Ratio: 1.5 (calc) (ref 1.0–2.5)
ALT: 34 U/L (ref 9–46)
AST: 19 U/L (ref 10–35)
Albumin: 4.3 g/dL (ref 3.6–5.1)
Alkaline phosphatase (APISO): 49 U/L (ref 35–144)
BUN: 19 mg/dL (ref 7–25)
CO2: 25 mmol/L (ref 20–32)
Calcium: 9.6 mg/dL (ref 8.6–10.3)
Chloride: 107 mmol/L (ref 98–110)
Creat: 1.28 mg/dL (ref 0.70–1.33)
GFR, Est African American: 71 mL/min/{1.73_m2} (ref >=60)
GFR, Est Non African American: 61 mL/min/{1.73_m2} (ref >=60)
Globulin: 2.9 g/dL (calc) (ref 1.9–3.7)
Glucose, Bld: 85 mg/dL (ref 65–99)
Potassium: 4.6 mmol/L (ref 3.5–5.3)
Sodium: 142 mmol/L (ref 135–146)
Total Bilirubin: 0.6 mg/dL (ref 0.2–1.2)
Total Protein: 7.2 g/dL (ref 6.1–8.1)

## 2019-02-05 LAB — HEMOGLOBIN A1C
Hgb A1c MFr Bld: 5.7 % of total Hgb — ABNORMAL HIGH (ref <5.7)
Mean Plasma Glucose: 117 (calc)
eAG (mmol/L): 6.5 (calc)

## 2019-02-05 LAB — HEPATITIS C ANTIBODY
Hepatitis C Ab: NONREACTIVE
SIGNAL TO CUT-OFF: 0.03 (ref <1.00)

## 2019-02-05 LAB — HIV ANTIBODY (ROUTINE TESTING W REFLEX): HIV 1&2 Ab, 4th Generation: NONREACTIVE

## 2019-02-05 LAB — LIPID PANEL
Cholesterol: 163 mg/dL (ref <200)
HDL: 56 mg/dL (ref >=40)
LDL Cholesterol (Calc): 93 mg/dL (calc)
Non-HDL Cholesterol (Calc): 107 mg/dL (calc) (ref <130)
Total CHOL/HDL Ratio: 2.9 (calc) (ref <5.0)
Triglycerides: 54 mg/dL (ref <150)

## 2019-02-05 LAB — PSA: PSA: 1.8 ng/mL (ref <=4.0)

## 2019-02-05 LAB — TSH: TSH: 3.24 mIU/L (ref 0.40–4.50)

## 2019-11-07 ENCOUNTER — Ambulatory Visit: Payer: Self-pay | Attending: Internal Medicine

## 2019-11-07 DIAGNOSIS — Z20828 Contact with and (suspected) exposure to other viral communicable diseases: Secondary | ICD-10-CM | POA: Insufficient documentation

## 2019-11-07 DIAGNOSIS — Z20822 Contact with and (suspected) exposure to covid-19: Secondary | ICD-10-CM

## 2019-11-08 LAB — NOVEL CORONAVIRUS, NAA: SARS-CoV-2, NAA: NOT DETECTED

## 2019-11-12 ENCOUNTER — Ambulatory Visit: Payer: BC Managed Care – PPO | Attending: Internal Medicine

## 2019-11-12 DIAGNOSIS — Z20828 Contact with and (suspected) exposure to other viral communicable diseases: Secondary | ICD-10-CM | POA: Insufficient documentation

## 2019-11-12 DIAGNOSIS — Z20822 Contact with and (suspected) exposure to covid-19: Secondary | ICD-10-CM

## 2019-11-14 LAB — NOVEL CORONAVIRUS, NAA: SARS-CoV-2, NAA: NOT DETECTED

## 2019-11-15 ENCOUNTER — Telehealth: Payer: Self-pay | Admitting: *Deleted

## 2019-11-15 NOTE — Telephone Encounter (Signed)
Patient called ,given negative covid resuls.

## 2020-03-25 ENCOUNTER — Encounter: Payer: Self-pay | Admitting: Family Medicine

## 2020-04-01 ENCOUNTER — Ambulatory Visit (INDEPENDENT_AMBULATORY_CARE_PROVIDER_SITE_OTHER): Payer: BC Managed Care – PPO | Admitting: Family Medicine

## 2020-04-01 ENCOUNTER — Other Ambulatory Visit: Payer: Self-pay

## 2020-04-01 ENCOUNTER — Encounter: Payer: Self-pay | Admitting: Family Medicine

## 2020-04-01 VITALS — BP 113/63 | HR 53 | Temp 97.1°F | Resp 16 | Ht 74.5 in | Wt 173.8 lb

## 2020-04-01 DIAGNOSIS — Z23 Encounter for immunization: Secondary | ICD-10-CM

## 2020-04-01 DIAGNOSIS — Z0000 Encounter for general adult medical examination without abnormal findings: Secondary | ICD-10-CM

## 2020-04-01 DIAGNOSIS — Z125 Encounter for screening for malignant neoplasm of prostate: Secondary | ICD-10-CM

## 2020-04-01 DIAGNOSIS — E559 Vitamin D deficiency, unspecified: Secondary | ICD-10-CM | POA: Diagnosis not present

## 2020-04-01 DIAGNOSIS — Z13220 Encounter for screening for lipoid disorders: Secondary | ICD-10-CM

## 2020-04-01 DIAGNOSIS — R7303 Prediabetes: Secondary | ICD-10-CM

## 2020-04-01 DIAGNOSIS — E039 Hypothyroidism, unspecified: Secondary | ICD-10-CM

## 2020-04-01 LAB — POCT URINALYSIS DIPSTICK
Bilirubin, UA: NEGATIVE
Blood, UA: NEGATIVE
Glucose, UA: NEGATIVE
Ketones, UA: NEGATIVE
Leukocytes, UA: NEGATIVE
Nitrite, UA: NEGATIVE
Protein, UA: NEGATIVE
Spec Grav, UA: 1.015 (ref 1.010–1.025)
Urobilinogen, UA: 0.2 E.U./dL
pH, UA: 5 (ref 5.0–8.0)

## 2020-04-01 NOTE — Progress Notes (Signed)
Subjective:    Patient ID: Bradley Carson, male    DOB: Jun 17, 1961, 59 y.o.   MRN: [id number]  ASHWATH CHAU is a 59 y.o. male presenting on 04/01/2020 for Annual Exam   HPI  HEALTH MAINTENANCE:  Weight/BMI: 4lb weight loss since 02/01/2019 Physical activity: Stays active Diet: Regular Seatbelt: Always Sunscreen: Does not but does meet with Dermatology.  Next dermatology visit is in July 2021 for skin screening in [place], [place] Prostate exam/PSA: Labs ordered today Colon cancer screening Completed 09/16/2017 HIV & Hep C Screening: Completed 02/02/2019 Optometry: Annually Dentistry: Twice a year  IMMUNIZATIONS: Influenza: Due next season Tetanus: Given today COVID: Completed 02/13/2020 and 03/12/2020  Depression screen [facility] 2/9 04/01/2020 03/31/2017  Decreased Interest 0 0  Down, Depressed, Hopeless 0 0  PHQ - 2 Score 0 0    Past Medical History:  Diagnosis Date  . Hypothyroidism    Past Surgical History:  Procedure Laterality Date  . CATARACT EXTRACTION Left   . EYE SURGERY  05/29/2015   Social History   Socioeconomic History  . Marital status: Married    Spouse name: Not on file  . Number of children: Not on file  . Years of education: Not on file  . Highest education level: Not on file  Occupational History  . Not on file  Tobacco Use  . Smoking status: Never Smoker  . Smokeless tobacco: Never Used  Substance and Sexual Activity  . Alcohol use: Yes    Alcohol/week: 0.0 standard drinks    Comment: occasional  . Drug use: No  . Sexual activity: Not on file  Other Topics Concern  . Not on file  Social History Narrative  . Not on file   Social Determinants of Health   Financial Resource Strain:   . Difficulty of Paying Living Expenses:   Food Insecurity:   . Worried About Charity fundraiser in the Last Year:   . Arboriculturist in the Last Year:   Transportation Needs:   . Film/video editor (Medical):   Marland Kitchen Lack of Transportation (Non-Medical):     Physical Activity:   . Days of Exercise per Week:   . Minutes of Exercise per Session:   Stress:   . Feeling of Stress :   Social Connections:   . Frequency of Communication with Friends and Family:   . Frequency of Social Gatherings with Friends and Family:   . Attends Religious Services:   . Active Member of Clubs or Organizations:   . Attends Archivist Meetings:   Marland Kitchen Marital Status:   Intimate Partner Violence:   . Fear of Current or Ex-Partner:   . Emotionally Abused:   Marland Kitchen Physically Abused:   . Sexually Abused:    Family History  Problem Relation Age of Onset  . Cataracts Mother    Current Outpatient Medications on File Prior to Visit  Medication Sig  . Ascorbic Acid (VITAMIN C) 1000 MG tablet Take 1,000 mg by mouth 2 (two) times daily.  . Cholecalciferol (VITAMIN D-3) 1000 UNITS CAPS Take 2 each day   No current facility-administered medications on file prior to visit.    Per HPI unless specifically indicated above     Objective:    BP 113/63 (BP Location: Left Arm, Patient Position: Sitting, Cuff Size: Normal)   Pulse (!) 53   Temp (!) 97.1 F (36.2 C) (Temporal)   Resp 16   Ht 6' 2.5" (1.892 m)  Wt 173 lb 12.8 oz (78.8 kg)   SpO2 100%   BMI 22.02 kg/m   Wt Readings from Last 3 Encounters:  04/01/20 173 lb 12.8 oz (78.8 kg)  02/01/19 177 lb (80.3 kg)  03/31/17 169 lb 12.8 oz (77 kg)    Physical Exam Vitals reviewed.  Constitutional:      General: He is not in acute distress.    Appearance: Normal appearance. He is well-developed, well-groomed and normal weight. He is not ill-appearing or toxic-appearing.  HENT:     Head: Normocephalic.     Right Ear: Tympanic membrane, ear canal and external ear normal. There is no impacted cerumen.     Left Ear: Tympanic membrane, ear canal and external ear normal. There is no impacted cerumen.     Nose: Nose normal. No congestion or rhinorrhea.     Mouth/Throat:     Mouth: Mucous membranes are moist.      Dentition: No gum lesions.     Tongue: No lesions.     Palate: No lesions.     Pharynx: Oropharynx is clear. No oropharyngeal exudate or posterior oropharyngeal erythema.  Eyes:     General: Lids are normal. Vision grossly intact. No scleral icterus.       Right eye: No discharge or hordeolum.        Left eye: No discharge or hordeolum.     Extraocular Movements: Extraocular movements intact.     Conjunctiva/sclera: Conjunctivae normal.     Pupils: Pupils are equal, round, and reactive to light.  Neck:     Thyroid: No thyroid mass, thyromegaly or thyroid tenderness.  Cardiovascular:     Rate and Rhythm: Normal rate and regular rhythm.     Pulses: Normal pulses.          Dorsalis pedis pulses are 2+ on the right side and 2+ on the left side.     Heart sounds: Normal heart sounds. No murmur. No friction rub. No gallop.   Pulmonary:     Effort: Pulmonary effort is normal. No respiratory distress.     Breath sounds: Normal breath sounds.  Abdominal:     General: Abdomen is flat. Bowel sounds are normal. There is no distension or abdominal bruit.     Palpations: Abdomen is soft. There is no hepatomegaly, splenomegaly or mass.     Tenderness: There is no abdominal tenderness. There is no guarding or rebound.     Hernia: No hernia is present.  Musculoskeletal:        General: Normal range of motion.     Cervical back: Normal range of motion and neck supple. No tenderness.     Right lower leg: No edema.     Left lower leg: No edema.     Comments: Strength 5/5 in bilateral upper/lower extremities  Feet:     Right foot:     Skin integrity: Skin integrity normal.     Left foot:     Skin integrity: Skin integrity normal.  Lymphadenopathy:     Cervical: No cervical adenopathy.  Skin:    General: Skin is warm and dry.     Capillary Refill: Capillary refill takes less than 2 seconds.  Neurological:     General: No focal deficit present.     Mental Status: He is alert and oriented to  person, place, and time.     Cranial Nerves: Cranial nerves are intact.     Sensory: Sensation is intact.     Motor:  Motor function is intact.     Coordination: Coordination is intact.     Gait: Gait is intact.     Deep Tendon Reflexes: Reflexes normal.  Psychiatric:        Attention and Perception: Attention and perception normal.        Mood and Affect: Mood and affect normal.        Speech: Speech normal.        Behavior: Behavior normal. Behavior is cooperative.        Thought Content: Thought content normal.        Cognition and Memory: Cognition and memory normal.        Judgment: Judgment normal.     Results for orders placed or performed in visit on 04/01/20  POCT Urinalysis Dipstick  Result Value Ref Range   Color, UA Yellow    Clarity, UA Clear    Glucose, UA Negative Negative   Bilirubin, UA negative    Ketones, UA negative    Spec Grav, UA 1.015 1.010 - 1.025   Blood, UA negative    pH, UA 5.0 5.0 - 8.0   Protein, UA Negative Negative   Urobilinogen, UA 0.2 0.2 or 1.0 E.U./dL   Nitrite, UA negative    Leukocytes, UA Negative Negative   Appearance     Odor        Assessment & Plan:   Problem List Items Addressed This Visit      Endocrine   Hypothyroidism    Reports history of, has been off of medications for many years.   Will have labs drawn for evaluation.      Relevant Orders   Thyroid Panel With TSH     Other   Vitamin D insufficiency    Status unknown.  Recheck labs.  Continue meds without changes today.  Followup after labs.       Relevant Orders   VITAMIN D 25 Hydroxy (Vit-D Deficiency, Fractures)   Routine medical exam    Annual physical exam without new findings.  Well adult with no acute concerns.  Plan: 1. Obtain health maintenance screenings as above according to age. - Increase physical activity to 30 minutes most days of the week.  - Eat healthy diet high in vegetables and fruits; low in refined carbohydrates. - Screening labs  and tests as ordered 2. Return 1 year for annual physical.       Prediabetes    Repeat A1C ordered with labs      Relevant Orders   HgB A1c    Other Visit Diagnoses    Annual physical exam    -  Primary   Relevant Orders   POCT Urinalysis Dipstick (Completed)   CBC with Differential   COMPLETE METABOLIC PANEL WITH GFR   Need for diphtheria-tetanus-pertussis (Tdap) vaccine       Relevant Orders   Tdap vaccine greater than or equal to 7yo IM (Completed)   Screening for malignant neoplasm of prostate       Relevant Orders   PSA   Screening for lipid disorders       Relevant Orders   Lipid Profile      No orders of the defined types were placed in this encounter.     Follow up plan: Return in about 1 year (around 04/01/2021) for CPE.  Harlin Rain, FNP-C Family Nurse Practitioner [facility] 04/01/2020, 11:35 AM

## 2020-04-01 NOTE — Patient Instructions (Signed)
As we discussed, have your labs drawn in the next 1-2 weeks and we will contact you with the results.  Well Visit, Ages 70 to 6: Care Instructions Overview  Well visits can help you stay healthy. Your provider has checked your overall health and may have suggested ways to take good care of yourself. Your provider also may have recommended tests. At home, you can help prevent illness with healthy eating, regular exercise, and other steps.  Follow-up care is a key part of your treatment and safety. Be sure to make and go to all appointments, and call your provider if you are having problems. It's also a good idea to know your test results and keep a list of the medicines you take.  How can you care for yourself at home?   Get screening tests that you and your doctor decide on. Screening helps find diseases before any symptoms appear.   Eat healthy foods. Choose fruits, vegetables, whole grains, protein, and low-fat dairy foods. Limit fat, especially saturated fat. Reduce salt in your diet.   Limit alcohol. If you are a man, have no more than 2 drinks a day or 14 drinks a week. If you are a woman, have no more than 1 drink a day or 7 drinks a week.   Get at least 30 minutes of physical activity on most days of the week.  We recommend you go no more than 2 days in a row without exercise. Walking is a good choice. You also may want to do other activities, such as running, swimming, cycling, or playing tennis or team sports. Discuss any changes in your exercise program with your provider.   Reach and stay at a healthy weight. This will lower your risk for many problems, such as obesity, diabetes, heart disease, and high blood pressure.   Do not smoke or allow others to smoke around you. If you need help quitting, talk to your provider about stop-smoking programs and medicines. These can increase your chances of quitting for good.  Can call 1-800-QUIT-NOW (561)352-3052) for the [facility], assistance with smoking cessation.   Care for your mental health. It is easy to get weighed down by worry and stress. Learn strategies to manage stress, like deep breathing and mindfulness, and stay connected with your family and community. If you find you often feel sad or hopeless, talk with your provider. Treatment can help.   Talk to your provider about whether you have any risk factors for sexually transmitted infections (STIs). You can help prevent STIs if you wait to have sex with a new partner (or partners) until you've each been tested for STIs. It also helps if you use condoms (male or male condoms) and if you limit your sex partners to one person who only has sex with you. Vaccines are available for some STIs, such as HPV (these are age dependent).   Use birth control if it's important to you to prevent pregnancy. Talk with your provider about the choices available and what might be best for you.   If you think you may have a problem with alcohol or drug use, talk to your provider. This includes prescription medicines (such as amphetamines and opioids) and illegal drugs (such as cocaine and methamphetamine). Your provider can help you figure out what type of treatment is best for you.   If you have concerns about domestic violence or intimate partner violence, there are resources available to you. [employer] Abuse Hotline 410-644-4146  Protect your skin from too much sun. When you're outdoors from 10 a.m. to 4 p.m., stay in the shade or cover up with clothing and a hat with a wide brim. Wear sunglasses that block UV rays. Even when it's cloudy, put broad-spectrum sunscreen (SPF 30 or higher) on any exposed skin.   See a dentist one or two times a year for checkups and to have your teeth cleaned.   See an eye doctor once per year for an eye exam.   Wear a seat belt in the car.  When should you call for help?  Watch closely for changes in your health, and be  sure to contact your provider if you have any problems or symptoms that concern you.  We will plan to see you back in 1 year for your next physical  You will receive a survey after today's visit either digitally by e-mail or paper by [place] mail. Your experiences and feedback matter to [place].  Please respond so we know how we are doing as we provide care for you.  Call us with any questions/concerns/needs.  It is my goal to be available to you for your health concerns.  Thanks for choosing me to be a partner in your healthcare needs!  Harlin Rain, FNP-C Family Nurse Practitioner [facility] Phone: [phone]

## 2020-04-01 NOTE — Assessment & Plan Note (Signed)
Annual physical exam without new findings.  Well adult with no acute concerns.  Plan: 1. Obtain health maintenance screenings as above according to age. - Increase physical activity to 30 minutes most days of the week.  - Eat healthy diet high in vegetables and fruits; low in refined carbohydrates. - Screening labs and tests as ordered 2. Return 1 year for annual physical.  

## 2020-04-01 NOTE — Assessment & Plan Note (Signed)
Reports history of, has been off of medications for many years.   Will have labs drawn for evaluation.

## 2020-04-01 NOTE — Assessment & Plan Note (Signed)
Repeat A1C ordered with labs

## 2020-04-01 NOTE — Assessment & Plan Note (Signed)
Status unknown.  Recheck labs.  Continue meds without changes today.  Followup after labs.  

## 2020-04-02 NOTE — Addendum Note (Signed)
Addended by: Verl Bangs on: 04/02/2020 04:38 PM   Modules accepted: Level of Service

## 2020-06-05 DIAGNOSIS — D485 Neoplasm of uncertain behavior of skin: Secondary | ICD-10-CM | POA: Diagnosis not present

## 2020-06-05 DIAGNOSIS — L578 Other skin changes due to chronic exposure to nonionizing radiation: Secondary | ICD-10-CM | POA: Diagnosis not present

## 2020-06-18 ENCOUNTER — Other Ambulatory Visit: Payer: Self-pay | Admitting: Family Medicine

## 2020-06-19 LAB — CMP12+LP+TP+TSH+6AC+PSA+CBC…
ALT: 21 IU/L (ref 0–44)
AST: 17 IU/L (ref 0–40)
Albumin/Globulin Ratio: 1.5 (ref 1.2–2.2)
Albumin: 4.4 g/dL (ref 3.8–4.9)
Alkaline Phosphatase: 61 IU/L (ref 48–121)
BUN/Creatinine Ratio: 10 (ref 10–24)
BUN: 12 mg/dL (ref 8–27)
Basophils Absolute: 0 10*3/uL (ref 0.0–0.2)
Basos: 1 %
Bilirubin Total: 0.3 mg/dL (ref 0.0–1.2)
Calcium: 9.2 mg/dL (ref 8.6–10.2)
Chloride: 104 mmol/L (ref 96–106)
Chol/HDL Ratio: 2.9 ratio (ref 0.0–5.0)
Cholesterol, Total: 172 mg/dL (ref 100–199)
Creatinine, Ser: 1.21 mg/dL (ref 0.76–1.27)
EOS (ABSOLUTE): 0.3 10*3/uL (ref 0.0–0.4)
Eos: 7 %
Estimated CHD Risk: <0.5 times avg. (ref 0.0–1.0)
Free Thyroxine Index: 2.4 (ref 1.2–4.9)
GFR calc Af Amer: 75 mL/min/{1.73_m2} (ref >59)
GFR calc non Af Amer: 65 mL/min/{1.73_m2} (ref >59)
GGT: 15 IU/L (ref 0–65)
Globulin, Total: 2.9 g/dL (ref 1.5–4.5)
Glucose: 103 mg/dL — ABNORMAL HIGH (ref 65–99)
HDL: 59 mg/dL (ref >39)
Hematocrit: 45.3 % (ref 37.5–51.0)
Hemoglobin: 14.8 g/dL (ref 13.0–17.7)
Immature Grans (Abs): 0 10*3/uL (ref 0.0–0.1)
Immature Granulocytes: 0 %
Iron: 76 ug/dL (ref 38–169)
LDH: 171 IU/L (ref 121–224)
LDL Chol Calc (NIH): 103 mg/dL — ABNORMAL HIGH (ref 0–99)
Lymphocytes Absolute: 1.3 10*3/uL (ref 0.7–3.1)
Lymphs: 28 %
MCH: 28.2 pg (ref 26.6–33.0)
MCHC: 32.7 g/dL (ref 31.5–35.7)
MCV: 87 fL (ref 79–97)
Monocytes Absolute: 0.5 10*3/uL (ref 0.1–0.9)
Monocytes: 11 %
Neutrophils Absolute: 2.5 10*3/uL (ref 1.4–7.0)
Neutrophils: 53 %
Phosphorus: 3.5 mg/dL (ref 2.8–4.1)
Platelets: 237 10*3/uL (ref 150–450)
Potassium: 4.8 mmol/L (ref 3.5–5.2)
Prostate Specific Ag, Serum: 1.7 ng/mL (ref 0.0–4.0)
RBC: 5.24 x10E6/uL (ref 4.14–5.80)
RDW: 13.2 % (ref 11.6–15.4)
Sodium: 141 mmol/L (ref 134–144)
T3 Uptake Ratio: 27 % (ref 24–39)
T4, Total: 9 ug/dL (ref 4.5–12.0)
TSH: 3.7 u[IU]/mL (ref 0.450–4.500)
Total Protein: 7.3 g/dL (ref 6.0–8.5)
Triglycerides: 51 mg/dL (ref 0–149)
Uric Acid: 6.6 mg/dL (ref 3.8–8.4)
VLDL Cholesterol Cal: 10 mg/dL (ref 5–40)
WBC: 4.7 10*3/uL (ref 3.4–10.8)

## 2020-11-13 DIAGNOSIS — Z20822 Contact with and (suspected) exposure to covid-19: Secondary | ICD-10-CM | POA: Diagnosis not present

## 2021-04-15 DIAGNOSIS — H00032 Abscess of right lower eyelid: Secondary | ICD-10-CM | POA: Diagnosis not present

## 2021-06-13 LAB — BASIC METABOLIC PANEL
BUN: 12 (ref 4–21)
Chloride: 104 (ref 99–108)
Creatinine: 1.2 (ref 0.6–1.3)
Glucose: 92
Potassium: 4.6 (ref 3.4–5.3)
Sodium: 142 (ref 137–147)

## 2021-06-13 LAB — CBC AND DIFFERENTIAL
HCT: 45 (ref 41–53)
Hemoglobin: 14.5 (ref 13.5–17.5)
Platelets: 222 (ref 150–399)

## 2021-06-13 LAB — COMPREHENSIVE METABOLIC PANEL
Albumin: 4.4 (ref 3.5–5.0)
Calcium: 9 (ref 8.7–10.7)
GFR calc non Af Amer: 66

## 2021-06-13 LAB — TSH: TSH: 3.51 (ref 0.41–5.90)

## 2021-06-13 LAB — LIPID PANEL
Cholesterol: 174 (ref 0–200)
HDL: 54 (ref 35–70)
LDL Cholesterol: 109
Triglycerides: 55 (ref 40–160)

## 2021-06-13 LAB — HEPATIC FUNCTION PANEL
ALT: 16 (ref 10–40)
AST: 20 (ref 14–40)
Alkaline Phosphatase: 69 (ref 25–125)
Bilirubin, Total: 0.4

## 2021-06-13 LAB — HEMOGLOBIN A1C: Hemoglobin A1C: 5.6

## 2021-06-13 LAB — VITAMIN D 25 HYDROXY (VIT D DEFICIENCY, FRACTURES): Vit D, 25-Hydroxy: 62.1

## 2021-06-25 ENCOUNTER — Encounter: Payer: Self-pay | Admitting: Family Medicine

## 2022-06-01 DIAGNOSIS — H26492 Other secondary cataract, left eye: Secondary | ICD-10-CM | POA: Diagnosis not present
